# Patient Record
Sex: Male | Born: 1960 | Race: White | Hispanic: No | Marital: Married | Smoking: Former smoker
Health system: Southern US, Community
[De-identification: ages and names within clinical notes are randomized; demographics above are authoritative.]

## PROBLEM LIST (undated history)

## (undated) DIAGNOSIS — J45909 Unspecified asthma, uncomplicated: Secondary | ICD-10-CM

## (undated) DIAGNOSIS — J309 Allergic rhinitis, unspecified: Secondary | ICD-10-CM

## (undated) DIAGNOSIS — R6882 Decreased libido: Secondary | ICD-10-CM

## (undated) DIAGNOSIS — K219 Gastro-esophageal reflux disease without esophagitis: Secondary | ICD-10-CM

## (undated) DIAGNOSIS — A048 Other specified bacterial intestinal infections: Secondary | ICD-10-CM

## (undated) DIAGNOSIS — G43909 Migraine, unspecified, not intractable, without status migrainosus: Secondary | ICD-10-CM

## (undated) DIAGNOSIS — G4700 Insomnia, unspecified: Secondary | ICD-10-CM

## (undated) DIAGNOSIS — F4323 Adjustment disorder with mixed anxiety and depressed mood: Secondary | ICD-10-CM

## (undated) DIAGNOSIS — L509 Urticaria, unspecified: Secondary | ICD-10-CM

## (undated) HISTORY — PX: NASAL SEPTUM SURGERY: SHX37

## (undated) HISTORY — PX: TOOTH EXTRACTION: SUR596

## (undated) HISTORY — PX: CYST EXCISION: SHX5701

---

## 1998-07-20 ENCOUNTER — Encounter: Payer: Self-pay | Admitting: *Deleted

## 1998-07-20 ENCOUNTER — Ambulatory Visit (HOSPITAL_COMMUNITY): Admission: RE | Admit: 1998-07-20 | Discharge: 1998-07-20 | Payer: Self-pay | Admitting: *Deleted

## 2004-11-14 ENCOUNTER — Ambulatory Visit: Payer: Self-pay | Admitting: Psychiatry

## 2004-11-15 ENCOUNTER — Inpatient Hospital Stay (HOSPITAL_COMMUNITY): Admission: RE | Admit: 2004-11-15 | Discharge: 2004-11-18 | Payer: Self-pay | Admitting: Psychiatry

## 2007-06-29 ENCOUNTER — Emergency Department (HOSPITAL_COMMUNITY): Admission: EM | Admit: 2007-06-29 | Discharge: 2007-06-29 | Payer: Self-pay | Admitting: Emergency Medicine

## 2008-08-06 ENCOUNTER — Ambulatory Visit (HOSPITAL_BASED_OUTPATIENT_CLINIC_OR_DEPARTMENT_OTHER): Admission: RE | Admit: 2008-08-06 | Discharge: 2008-08-06 | Payer: Self-pay | Admitting: General Surgery

## 2008-08-06 ENCOUNTER — Encounter (INDEPENDENT_AMBULATORY_CARE_PROVIDER_SITE_OTHER): Payer: Self-pay | Admitting: General Surgery

## 2010-06-17 ENCOUNTER — Emergency Department (HOSPITAL_COMMUNITY)
Admission: EM | Admit: 2010-06-17 | Discharge: 2010-06-17 | Disposition: A | Discharge status: Home / Self Care | Payer: No Typology Code available for payment source | Attending: Emergency Medicine | Admitting: Emergency Medicine

## 2010-06-17 DIAGNOSIS — S335XXA Sprain of ligaments of lumbar spine, initial encounter: Secondary | ICD-10-CM | POA: Insufficient documentation

## 2010-06-17 DIAGNOSIS — M545 Low back pain, unspecified: Secondary | ICD-10-CM | POA: Insufficient documentation

## 2010-06-17 DIAGNOSIS — J45909 Unspecified asthma, uncomplicated: Secondary | ICD-10-CM | POA: Insufficient documentation

## 2010-07-05 LAB — POCT HEMOGLOBIN-HEMACUE: Hemoglobin: 15.5 g/dL (ref 13.0–17.0)

## 2010-08-09 NOTE — Op Note (Signed)
NAME:  Jeffery Dougherty, Jeffery Dougherty               ACCOUNT NO.:  [id number]   MEDICAL RECORD NO.:  [id number]          PATIENT TYPE:  AMB   LOCATION:  DSC                          FACILITY:  [facility]   PHYSICIAN:  Gabrielle Dare. Janee Morn, M.D.DATE OF BIRTH:  26-Jan-1961   DATE OF PROCEDURE:  08/06/2008  DATE OF DISCHARGE:                               OPERATIVE REPORT   PREOPERATIVE DIAGNOSIS:  Mass left flank.   POSTOPERATIVE DIAGNOSIS:  Mass left flank.   PROCEDURE:  Excision of mass left flank 4 cm with layered closure.   SURGEON:  Gabrielle Dare. Janee Morn, MD   ANESTHESIA:  General endotracheal.   HISTORY OF PRESENT ILLNESS:  Mr. Monestime is a 50 year old gentleman who I  evaluated in the office for a symptomatic mass on his left flank.  This  is consistent on physical exam with lipoma.  He presents for elective  excision.   PROCEDURE IN DETAIL:  The patient received intravenous antibiotics.  He  was identified in the preop holding area and informed consent was  obtained.  He was brought to the operating room.  General endotracheal  anesthesia was administered by the anesthesia staff.  He was placed in  right side down lateral position.  His lesion had been marked  preoperatively.  This area on the left flank was prepped and draped in  sterile fashion.  Time-out procedure was done.  Marcaine 0.25% with  epinephrine was injected along the planned line of incision and around  this palpable mass.  The incision was made along the tissue lines.  Subcutaneous tissues were dissected down revealing a partially  encapsulated lobulated mass.  This was circumferentially dissected and  excised.  Beneath it, there was another similar mass though it was a  little bit smaller and this was also partially encapsulated.  This was  completely excised as well.  Hemostasis was obtained with Bovie cautery.  Some additional local anesthetic was injected.  The area was irrigated.  Hemostasis was ensured and the wound was then  closed in layers.  After  determining there was no further abnormal mass in the subcutaneous  planes, subcutaneous tissues were approximated with interrupted 3-0  Vicryl suture and the skin was closed with a running 4-0 Monocryl  subcuticular stitch followed by Dermabond.  Sponge, needle, and  instrument counts were all correct.  The patient tolerated the procedure  well without apparent complication and was taken to recovery room in  stable condition.      Gabrielle Dare Janee Morn, M.D.  Electronically Signed     BET/MEDQ  D:  08/06/2008  T:  08/07/2008  Job:  [id number]

## 2010-08-12 NOTE — Discharge Summary (Signed)
NAME:  Jeffery Dougherty, Jeffery Dougherty NO.:  [id number]   MEDICAL RECORD NO.:  [id number]          PATIENT TYPE:  IPS   LOCATION:  0507                          FACILITY:  [facility]   PHYSICIAN:  Geoffery Lyons, M.D.      DATE OF BIRTH:  1960/10/07   DATE OF ADMISSION:  11/15/2004  DATE OF DISCHARGE:  11/18/2004                                 DISCHARGE SUMMARY   CHIEF COMPLAINT AND PRESENT ILLNESS:  This was the first admission to [facility] for this 50 year old married white male voluntarily  admitted.  History of depression, suicidal thoughts, no specific plan.  Very  stressed.  His ex-wife took custody of his 65 year old daughter a year prior  to this admission.  The daughter was living with him for the past 7-8 years  prior to ex-wife again taking custody of his child.  Lost weight, lost about  15 pounds.  Sleeping has been decreased.  Has been having passive suicidal  thoughts.  Stated that he did not want to be in the unit, pressing homicidal  ideation towards his ex-wife.  Denied any psychotic symptoms.   PAST PSYCHIATRIC HISTORY:  First time at [facility].   ALCOHOL/DRUG HISTORY:  Denies the active use of any substances.   MEDICAL HISTORY:  Noncontributory.   MEDICATIONS:  Claims he takes an occasional Ambien.   PHYSICAL EXAMINATION:  Performed and failed to show any acute findings.   LABORATORY DATA:  CBC with white blood cells 6.0, hemoglobin 16.1.  Blood  chemistry with SGOT 20, SGPT 18, TSH 0.487.  Urine drug screen positive for  marijuana.   MENTAL STATUS EXAM:  Fully alert, cooperative male with fair eye contact.  Speech was clear, normal rate, tempo and production.  Mood feeling hopeless,  depressed, anxious.  Affect teary-eyed.  Thought processes were logical,  coherent and relevant.  No evidence of delusions.  No active suicidal or  homicidal ideation.  Ruminating over his daughter and the situation with his  ex-wife.  No hallucinations.   Cognition was well-preserved.   ADMISSION DIAGNOSES:  AXIS I:  Major depressive disorder.  Marijuana abuse.  AXIS II:  No diagnosis.  AXIS III:  No diagnosis.  AXIS IV:  Moderate.  AXIS V:  GAF upon admission 35; highest GAF in the last year 65.   HOSPITAL COURSE:  He was admitted.  He was started in individual and group  psychotherapy.  He was given trazodone for sleep.  He was given Zyprexa 5 mg  every six hours as needed for agitation.  He was prescribed some trazodone  and some Ativan.  Placed on Zoloft 25 mg per day and Seroquel at bedtime.  Zoloft was increased to 50 mg.  He endorsed he had difficulty dealing with  the conflict with the ex-wife which resulted in losing custody of the  daughter.  Claims his ex-wife has influenced the daughter and she now does  not want to see him.  Endorsed she has gotten increasingly more upset,  depressed, cannot see the __________ stress, cannot stop thinking about the  daughter,  worries, ruminates, endorses crying spells, cannot be settled  enough to do his job.  Overwhelmed with what was going on with the daughter.  Some passive suicidal ideation.  Could contract but was feeling like giving  up.  We worked with the grief, the loss, coping skills.  We worked with the  Zoloft.  On August 22nd, he claimed that he realized that there was little  he could do in terms of the relationship with the daughter other than  keeping the door open.  Continued to deal with the loss of the relationship,  dealing with the frustration and not being able to see her.  Was able to  express how angry and bitter he was.  As the hospitalization progressed, he  was able to work on his coping skills, on his frustration and his anger.  On  August 25th, he felt he had obtained full benefit from the hospitalization.  He was in full contact with reality.  There were no suicidal ideation, no  homicidal ideation, no hallucinations, no delusions, encouraged as he was  starting  to feel better.  Has worked on Pharmacologist, stress management,  willing to pursue further stabilization through outpatient work.   DISCHARGE DIAGNOSES:  AXIS I:  Major depression.  Anxiety disorder not  otherwise specified.  AXIS II:  No diagnosis.  AXIS III:  No diagnosis.  AXIS IV:  Moderate.  AXIS V:  GAF upon discharge 55.   DISCHARGE MEDICATIONS:  1.  Trazodone 50 mg at night for sleep.  2.  Seroquel 25 mg at night.  3.  Zoloft 50 mg per day.   FOLLOW UP:  [employer] consultant.      Geoffery Lyons, M.D.  Electronically Signed     IL/MEDQ  D:  12/19/2004  T:  12/20/2004  Job:  [id number]

## 2010-08-12 NOTE — H&P (Signed)
NAME:  Jeffery Dougherty, Jeffery Dougherty NO.:  [id number]   MEDICAL RECORD NO.:  [id number]          PATIENT TYPE:  IPS   LOCATION:  0407                          FACILITY:  [facility]   PHYSICIAN:  Geoffery Lyons, M.D.      DATE OF BIRTH:  13-May-1960   DATE OF ADMISSION:  11/15/2004  DATE OF DISCHARGE:                         PSYCHIATRIC ADMISSION ASSESSMENT   A 50 year old married white male voluntarily admitted on November 14, 2004.   HISTORY OF PRESENT ILLNESS:  Patient is admitted with a history of  depression with suicidal thoughts but no specific plan.  The patient is very  stressed.  His ex-wife took custody of his 70 year old daughter  approximately a year ago.  Patient's daughter was living with him for the  past 7-8 years prior to ex-wife again taking custody of this child.  He has  lot weight, lost about 15 pounds.  His sleeping has been decreased.  He has  been having passive suicidal thoughts and states he does not want to be  here.  Patient is expressing homicidal ideation towards his ex-wife.  He  denies any psychotic symptoms.   PAST PSYCHIATRIC HISTORY:  First admission to [facility]   SOCIAL HISTORY:  This is a 50 year old married white male, second marriage.  Married for 5 years.  He has a 74 year old step-son and a 74 year old  daughter who is currently with the ex-wife.  Patient has a high school  diploma and works at a [employer].  No criminal charges.   FAMILY HISTORY:  Denies.   ALCOHOL AND DRUG HISTORY:  Patient smokes.  No apparent alcohol or drug use.   PRIMARY CARE Yazmin Locher:  None.   MEDICAL PROBLEMS:  None.   MEDICATIONS:  Patient states he takes an occasional Ambien for sleep.   DRUG ALLERGIES:  No known drug allergies.   PHYSICAL EXAMINATION:  Patient was assessed at [facility].  This is a male  of short stature, well nourished in no acute distress.  His hemoglobin is  17, hematocrit 50.  BMET is within normal limits.  Urine drug  screen  positive for THC.  Alcohol level less than 5.  His vital signs are within  normal limits with a blood pressure of 128/88.  He is 5 feet 4 inches tall,  weight 102 pounds.   MENTAL STATUS EXAM:  He is a fully alert male cooperative with fair eye  contact.  Speech is clear with normal pace and tone.  Patient is feeling  very hopeless, depressed and anxious.  Patient is very teary-eyed.  Thought  processes are coherent, no evidence of psychosis.  Patient is, however,  ruminating over his daughter and situation with his ex-wife.  Cognitive  function intact.  Memory is good.  Judgment and insight are fair.  Average  intelligence.   ADMISSION DIAGNOSES:  AXIS I:  Major depressive disorder.  Tetrahydrocannabinol abuse.  AXIS II:  Deferred.  AXIS III:  None.  AXIS IV:  Problems with primary support group, other psychosocial problems.  AXIS V:  Current is 35, past year 68.   PLAN:  Stabilize mood and thinking, contract for safety.  We will initiate  Zoloft.  Risks and benefits were discussed.  Medication compliance will be  reinforced.  We will consider family session with his wife.  Patient is to  follow up with mental health.  He many need some therapy.  Tentative length  of stay is 5-6 days.      Landry Corporal, N.P.      Geoffery Lyons, M.D.  Electronically Signed    JO/MEDQ  D:  11/16/2004  T:  11/16/2004  Job:  [id number]

## 2012-05-27 ENCOUNTER — Encounter (HOSPITAL_COMMUNITY): Payer: Self-pay | Admitting: Emergency Medicine

## 2012-05-27 ENCOUNTER — Emergency Department (HOSPITAL_COMMUNITY)
Admission: EM | Admit: 2012-05-27 | Discharge: 2012-05-27 | Disposition: A | Discharge status: Home / Self Care | Payer: 59 | Attending: Emergency Medicine | Admitting: Emergency Medicine

## 2012-05-27 ENCOUNTER — Emergency Department (HOSPITAL_COMMUNITY): Payer: 59

## 2012-05-27 DIAGNOSIS — G43909 Migraine, unspecified, not intractable, without status migrainosus: Secondary | ICD-10-CM | POA: Insufficient documentation

## 2012-05-27 DIAGNOSIS — Z79899 Other long term (current) drug therapy: Secondary | ICD-10-CM | POA: Insufficient documentation

## 2012-05-27 DIAGNOSIS — Z87891 Personal history of nicotine dependence: Secondary | ICD-10-CM | POA: Insufficient documentation

## 2012-05-27 DIAGNOSIS — J45909 Unspecified asthma, uncomplicated: Secondary | ICD-10-CM | POA: Insufficient documentation

## 2012-05-27 DIAGNOSIS — R51 Headache: Secondary | ICD-10-CM | POA: Insufficient documentation

## 2012-05-27 HISTORY — DX: Unspecified asthma, uncomplicated: J45.909

## 2012-05-27 HISTORY — DX: Migraine, unspecified, not intractable, without status migrainosus: G43.909

## 2012-05-27 MED ORDER — ONDANSETRON 4 MG PO TBDP
8.0000 mg | ORAL_TABLET | Freq: Once | ORAL | Status: AC
  Administered 2012-05-27: 8 mg via ORAL
  Filled 2012-05-27: qty 2

## 2012-05-27 MED ORDER — OXYCODONE-ACETAMINOPHEN 5-325 MG PO TABS
1.0000 | ORAL_TABLET | Freq: Once | ORAL | Status: AC
  Administered 2012-05-27: 1 via ORAL
  Filled 2012-05-27: qty 1

## 2012-05-27 NOTE — ED Provider Notes (Signed)
History     CSN: [id number]  Arrival date & time 05/27/12  1338   First MD Initiated Contact with Patient 05/27/12 1555      Chief Complaint  Patient presents with  . Headache    (Consider location/radiation/quality/duration/timing/severity/associated sxs/prior treatment) HPI Comments: Jeffery Dougherty is a 52 y.o. Male who complains of a headache that started yesterday. He has had similar headaches for months, approximately every 2 weeks. The pain is still in bifrontal. He saw his doctor about the pain and was told that it might be a sinus problem, aggravated by cold weather. He denies blurred vision, fever, chills, nausea, vomiting. He has had some weakness, occasionally, when he gets the headache. Typically he goes to sleep and the pain gets better. His doctor, has treated him with IM medications, in the office. He has not been seen by a specialist, yet. There are no known modifying factors . He denies use of tobacco, alcohol, or illegal drugs.  Patient is a 52 y.o. male presenting with headaches. The history is provided by the patient.  Headache   Past Medical History  Diagnosis Date  . Asthma   . Migraine     History reviewed. No pertinent past surgical history.  History reviewed. No pertinent family history.  History  Substance Use Topics  . Smoking status: Former Games developer  . Smokeless tobacco: Not on file  . Alcohol Use: No      Review of Systems  Neurological: Positive for headaches.  All other systems reviewed and are negative.    Allergies  Review of patient's allergies indicates no known allergies.  Home Medications   Current Outpatient Rx  Name  Route  Sig  Dispense  Refill  . albuterol (PROVENTIL HFA;VENTOLIN HFA) 108 (90 BASE) MCG/ACT inhaler   Inhalation   Inhale 2 puffs into the lungs 4 (four) times daily as needed for wheezing or shortness of breath.         . ALPRAZolam (XANAX) 0.5 MG tablet   Oral   Take 0.5 mg by mouth once.         Marland Kitchen  aspirin-acetaminophen-caffeine (EXCEDRIN MIGRAINE) 250-250-65 MG per tablet   Oral   Take 2 tablets by mouth daily as needed for pain.         . cetirizine (ZYRTEC) 5 MG tablet   Oral   Take 5 mg by mouth daily.         Marland Kitchen Dextromethorphan-Guaifenesin (TUSSIN COUGH DM PO)   Oral   Take 5 mLs by mouth once.         Marland Kitchen esomeprazole (NEXIUM) 40 MG capsule   Oral   Take 40 mg by mouth daily before breakfast.         . Multiple Vitamin (MULTIVITAMIN WITH MINERALS) TABS   Oral   Take 1 tablet by mouth daily.           BP 120/83  Pulse 85  Temp(Src) 97.9 F (36.6 C) (Oral)  Resp 18  SpO2 98%  Physical Exam  Nursing note and vitals reviewed. Constitutional: He is oriented to person, place, and time. He appears well-developed and well-nourished.  HENT:  Head: Normocephalic and atraumatic.  Right Ear: External ear normal.  Left Ear: External ear normal.  Eyes: Conjunctivae and EOM are normal. Pupils are equal, round, and reactive to light.  Neck: Normal range of motion and phonation normal. Neck supple. No tracheal deviation present.  No meningismus  Cardiovascular: Normal rate, regular rhythm, normal heart  sounds and intact distal pulses.   Pulmonary/Chest: Effort normal and breath sounds normal. He exhibits no bony tenderness.  Abdominal: Soft. Normal appearance. There is no tenderness.  Musculoskeletal: Normal range of motion.  Neurological: He is alert and oriented to person, place, and time. He has normal strength. No cranial nerve deficit or sensory deficit. He exhibits normal muscle tone. Coordination normal.  Romberg negative. Able to stand without dizziness. Able to stand on his leg, independently, and balance for 10 seconds.  Skin: Skin is warm, dry and intact.  Psychiatric: He has a normal mood and affect. His behavior is normal. Judgment and thought content normal.    ED Course  Procedures (including critical care time)  CT ordered to rule out  intracranial mass effect.     Labs Reviewed - No data to display Ct Head Wo Contrast  05/27/2012  *RADIOLOGY REPORT*  Clinical Data: Headache.  CT HEAD WITHOUT CONTRAST  Technique:  Contiguous axial images were obtained from the base of the skull through the vertex without contrast.  Comparison: None.  Findings: The brain appears normal without infarct, hemorrhage, mass lesion, mass effect, midline shift or abnormal extra-axial fluid collection.  There is no hydrocephalus or pneumocephalus. The calvarium is intact.  Imaged paranasal sinuses and mastoid air cells are clear.  IMPRESSION: Negative exam.   Original Report Authenticated By: Holley Dexter, M.D.    Nursing notes, applicable records and vitals reviewed.  Radiologic Images/Reports reviewed.   1. Headache       MDM  Nonspecific headache. Doubt meningitis, acute, brain bleed, or sinusitis. CT scan,   Plan: Home Medications- OTC analgesia; Home Treatments- Rest. Work release 1 day; Recommended follow up- PCP for check up next week       Flint Melter, MD 05/27/12 [phone]

## 2012-05-27 NOTE — ED Notes (Signed)
Pt states he has been having migraines for 2 mo and has been following up with his pcp/ Pt states he had headache yesterday, awoke this am and his mother states pt was slurring speech this am and "i just couldn't think straight." Currently no slurred speech noted. Pt states he has been nauseous and had diarrhea this am. Pt c/o sensitivity to light and sound. Pt states he has also been having chills.

## 2012-05-27 NOTE — ED Notes (Signed)
Pt c/o frequent migraine that is normally treated by his PCP; pt sts nausea and blurry vision

## 2013-07-20 ENCOUNTER — Emergency Department (HOSPITAL_BASED_OUTPATIENT_CLINIC_OR_DEPARTMENT_OTHER): Payer: 59

## 2013-07-20 ENCOUNTER — Emergency Department (HOSPITAL_BASED_OUTPATIENT_CLINIC_OR_DEPARTMENT_OTHER)
Admission: EM | Admit: 2013-07-20 | Discharge: 2013-07-20 | Disposition: A | Discharge status: Home / Self Care | Payer: 59 | Attending: Emergency Medicine | Admitting: Emergency Medicine

## 2013-07-20 ENCOUNTER — Encounter (HOSPITAL_BASED_OUTPATIENT_CLINIC_OR_DEPARTMENT_OTHER): Payer: Self-pay | Admitting: Emergency Medicine

## 2013-07-20 DIAGNOSIS — R1032 Left lower quadrant pain: Secondary | ICD-10-CM | POA: Insufficient documentation

## 2013-07-20 DIAGNOSIS — K219 Gastro-esophageal reflux disease without esophagitis: Secondary | ICD-10-CM | POA: Insufficient documentation

## 2013-07-20 DIAGNOSIS — Z79899 Other long term (current) drug therapy: Secondary | ICD-10-CM | POA: Insufficient documentation

## 2013-07-20 DIAGNOSIS — J45909 Unspecified asthma, uncomplicated: Secondary | ICD-10-CM | POA: Insufficient documentation

## 2013-07-20 DIAGNOSIS — G43909 Migraine, unspecified, not intractable, without status migrainosus: Secondary | ICD-10-CM | POA: Insufficient documentation

## 2013-07-20 DIAGNOSIS — F4323 Adjustment disorder with mixed anxiety and depressed mood: Secondary | ICD-10-CM | POA: Insufficient documentation

## 2013-07-20 DIAGNOSIS — Z8619 Personal history of other infectious and parasitic diseases: Secondary | ICD-10-CM | POA: Insufficient documentation

## 2013-07-20 DIAGNOSIS — Z87891 Personal history of nicotine dependence: Secondary | ICD-10-CM | POA: Insufficient documentation

## 2013-07-20 DIAGNOSIS — R109 Unspecified abdominal pain: Secondary | ICD-10-CM

## 2013-07-20 DIAGNOSIS — Z872 Personal history of diseases of the skin and subcutaneous tissue: Secondary | ICD-10-CM | POA: Insufficient documentation

## 2013-07-20 HISTORY — DX: Allergic rhinitis, unspecified: J30.9

## 2013-07-20 HISTORY — DX: Adjustment disorder with mixed anxiety and depressed mood: F43.23

## 2013-07-20 HISTORY — DX: Decreased libido: R68.82

## 2013-07-20 HISTORY — DX: Gastro-esophageal reflux disease without esophagitis: K21.9

## 2013-07-20 HISTORY — DX: Urticaria, unspecified: L50.9

## 2013-07-20 HISTORY — DX: Other specified bacterial intestinal infections: A04.8

## 2013-07-20 HISTORY — DX: Insomnia, unspecified: G47.00

## 2013-07-20 MED ORDER — HYDROMORPHONE HCL PF 1 MG/ML IJ SOLN
0.5000 mg | Freq: Once | INTRAMUSCULAR | Status: AC
  Administered 2013-07-20: 0.5 mg via INTRAVENOUS
  Filled 2013-07-20: qty 1

## 2013-07-20 MED ORDER — ONDANSETRON HCL 4 MG/2ML IJ SOLN
4.0000 mg | Freq: Once | INTRAMUSCULAR | Status: AC
  Administered 2013-07-20: 4 mg via INTRAVENOUS
  Filled 2013-07-20: qty 2

## 2013-07-20 MED ORDER — SODIUM CHLORIDE 0.9 % IV SOLN
Freq: Once | INTRAVENOUS | Status: AC
  Administered 2013-07-20: 18:00:00 via INTRAVENOUS

## 2013-07-20 NOTE — ED Notes (Signed)
C/o left abdominal pain that radiates to his back.  C/o nausea, vomiting, and diarrhea.  Denies fever, dysuria.

## 2013-07-20 NOTE — Discharge Instructions (Signed)
Abdominal Pain, Adult °Many things can cause abdominal pain. Usually, abdominal pain is not caused by a disease and will improve without treatment. It can often be observed and treated at home. Your health care provider will do a physical exam and possibly order blood tests and X-rays to help determine the seriousness of your pain. However, in many cases, more time must pass before a clear cause of the pain can be found. Before that point, your health care provider may not know if you need more testing or further treatment. °HOME CARE INSTRUCTIONS  °Monitor your abdominal pain for any changes. The following actions may help to alleviate any discomfort you are experiencing: °· Only take over-the-counter or prescription medicines as directed by your health care provider. °· Do not take laxatives unless directed to do so by your health care provider. °· Try a clear liquid diet (broth, tea, or water) as directed by your health care provider. Slowly move to a bland diet as tolerated. °SEEK MEDICAL CARE IF: °· You have unexplained abdominal pain. °· You have abdominal pain associated with nausea or diarrhea. °· You have pain when you urinate or have a bowel movement. °· You experience abdominal pain that wakes you in the night. °· You have abdominal pain that is worsened or improved by eating food. °· You have abdominal pain that is worsened with eating fatty foods. °SEEK IMMEDIATE MEDICAL CARE IF:  °· Your pain does not go away within 2 hours. °· You have a fever. °· You keep throwing up (vomiting). °· Your pain is felt only in portions of the abdomen, such as the right side or the left lower portion of the abdomen. °· You pass bloody or black tarry stools. °MAKE SURE YOU: °· Understand these instructions.   °· Will watch your condition.   °· Will get help right away if you are not doing well or get worse.   °Document Released: 12/21/2004 Document Revised: 01/01/2013 Document Reviewed: 11/20/2012 °Exit[employer]® Patient  Information ©2014 [employer]. ° °

## 2013-07-20 NOTE — ED Provider Notes (Signed)
CSN: [id number]     Arrival date & time 07/20/13  1601 History   First MD Initiated Contact with Patient 07/20/13 1639     Chief Complaint  Patient presents with  . Abdominal Pain     (Consider location/radiation/quality/duration/timing/severity/associated sxs/prior Treatment) Patient is a 53 y.o. male presenting with abdominal pain. The history is provided by the patient and the spouse. No language interpreter was used.  Abdominal Pain Associated symptoms: no dysuria, no fever, no hematuria, no nausea and no vomiting   Associated symptoms comment:  Abdominal pain in the LLQ radiating into left lower back for the past 2 weeks. He notices more in the mornings while getting ready for work and the pain eases off during the day. For the past several days the pain has been lasting the majority of the day. No N, V. No change in bowel movements or melena. No fever. He was seen by his doctor this week and was sent here for further evaluation by CT scan for abnormal lab studies, specifically an elevated WBC count of 16.   Past Medical History  Diagnosis Date  . Asthma   . Migraine   . Adjustment disorder with mixed anxiety and depressed mood   . Allergic rhinitis   . Decreased libido   . Esophageal reflux   . Helicobacter pylori (H. pylori) infection   . Hives   . Insomnia   . Migraine    Past Surgical History  Procedure Laterality Date  . Tooth extraction    . Cyst excision      Back, Lip  . Nasal septum surgery     No family history on file. History  Substance Use Topics  . Smoking status: Former Games developermoker  . Smokeless tobacco: Not on file  . Alcohol Use: No    Review of Systems  Constitutional: Negative for fever.  Respiratory: Negative.   Cardiovascular: Negative.   Gastrointestinal: Positive for abdominal pain. Negative for nausea, vomiting and blood in stool.  Genitourinary: Negative.  Negative for dysuria, hematuria and testicular pain.  Musculoskeletal: Negative.   Negative for myalgias.  Neurological: Negative.  Negative for weakness.      Allergies  Review of patient's allergies indicates no known allergies.  Home Medications   Prior to Admission medications   Medication Sig Start Date End Date Taking? Authorizing Provider  albuterol (PROVENTIL HFA;VENTOLIN HFA) 108 (90 BASE) MCG/ACT inhaler Inhale 2 puffs into the lungs 4 (four) times daily as needed for wheezing or shortness of breath.    Historical Provider, MD  ALPRAZolam Prudy Feeler(XANAX) 0.5 MG tablet Take 0.5 mg by mouth once.    Historical Provider, MD  aspirin-acetaminophen-caffeine (EXCEDRIN MIGRAINE) 254-268-1420250-250-65 MG per tablet Take 2 tablets by mouth daily as needed for pain.    Historical Provider, MD  cetirizine (ZYRTEC) 5 MG tablet Take 5 mg by mouth daily.    Historical Provider, MD  Dextromethorphan-Guaifenesin (TUSSIN COUGH DM PO) Take 5 mLs by mouth once.    Historical Provider, MD  esomeprazole (NEXIUM) 40 MG capsule Take 40 mg by mouth daily before breakfast.    Historical Provider, MD  Multiple Vitamin (MULTIVITAMIN WITH MINERALS) TABS Take 1 tablet by mouth daily.    Historical Provider, MD   BP 138/76  Pulse 75  Temp(Src) 98.2 F (36.8 C)  Resp 16  Ht 5\' 4"  (1.626 m)  Wt 118 lb (53.524 kg)  BMI 20.24 kg/m2  SpO2 99% Physical Exam  Constitutional: He is oriented to person, place, and time.  He appears well-developed and well-nourished.  HENT:  Head: Normocephalic.  Neck: Normal range of motion. Neck supple.  Cardiovascular: Normal rate and regular rhythm.   Pulmonary/Chest: Effort normal and breath sounds normal.  Abdominal: Soft. Bowel sounds are normal. There is tenderness. There is no rebound and no guarding.  LLQ tenderness with mild guarding. No rebound tenderness.   Genitourinary:  Mild left CVA tenderness.  Musculoskeletal: Normal range of motion.  Neurological: He is alert and oriented to person, place, and time.  Skin: Skin is warm and dry. No rash noted.   Psychiatric: He has a normal mood and affect.    ED Course  Procedures (including critical care time) Labs Review Labs Reviewed - No data to display Ct Abdomen Pelvis W Contrast  07/20/2013   CLINICAL DATA:  Left side abdominal pain radiating to the back. Elevated white blood cell count.  EXAM: CT ABDOMEN AND PELVIS WITH CONTRAST  TECHNIQUE: Multidetector CT imaging of the abdomen and pelvis was performed using the standard protocol following bolus administration of intravenous contrast.  CONTRAST:  100 cc Omnipaque 300.  COMPARISON:  None.  FINDINGS: The lung bases are clear.  No pleural or pericardial effusion.  A few scattered tiny hypo attenuating lesions are identified in the liver measuring less than 1 cm which likely represent cysts. The liver otherwise appears normal. The gallbladder, spleen, biliary tree, kidneys, adrenal glands and pancreas appear normal. Urinary bladder is unremarkable. The stomach, small and large bowel and appendix are unremarkable. There is no lymphadenopathy or fluid. No lytic or sclerotic bony lesion is identified. Degenerative disc disease L5-S1 is noted.  IMPRESSION: No acute finding abdomen or pelvis. No finding to explain the patient's symptoms.   Electronically Signed   By: Drusilla Kannerhomas  Dalessio M.D.   On: 07/20/2013 20:15   Imaging Review No results found.   EKG Interpretation None      MDM   Final diagnoses:  None    1. Abdominal pain  He has a negative CT scan in ED tonight. [facility] CBC, UA, BMET, Lipase, Amylase, with only abnormality of leukocytosis of 16. He appears comfortable here. No vomiting. Sitting up talking with family. Pain for 2 weeks now with negative CT. He has a GI doctor where he is encouraged to follow up this week. Return precautions given.    Arnoldo HookerShari A Xai Frerking, PA-C 07/20/13 2053

## 2013-07-20 NOTE — ED Notes (Signed)
[facility] labs [facility] a [facility]

## 2013-07-20 NOTE — ED Provider Notes (Signed)
Medical screening examination/treatment/procedure(s) were performed by non-physician practitioner and as supervising physician I was immediately available for consultation/collaboration.   EKG Interpretation None        Rolan BuccoMelanie Kadence Mikkelson, MD 07/20/13 2109

## 2013-08-08 ENCOUNTER — Other Ambulatory Visit: Payer: Self-pay | Admitting: Gastroenterology

## 2015-11-20 IMAGING — CT CT ABD-PELV W/ CM
2 of 5 series · 17 of 46 positions shown, 19 images · IV contrast (omnipaque)
Comparison: None.

CLINICAL DATA: Left side abdominal pain radiating to the back.
Elevated white blood cell count.

EXAM:
CT ABDOMEN AND PELVIS WITH CONTRAST
TECHNIQUE: Multidetector CT imaging of the abdomen and pelvis was performed
using the standard protocol following bolus administration of
intravenous contrast.
CONTRAST:  100 cc Omnipaque 300.

[Series 2: abd/pelvis 5.0 b31f · axial · 0.64mm/px · z∈[-451,-101]mm · 14 of 80 slices shown, 16 images]
[im 5/80  soft-tissue]
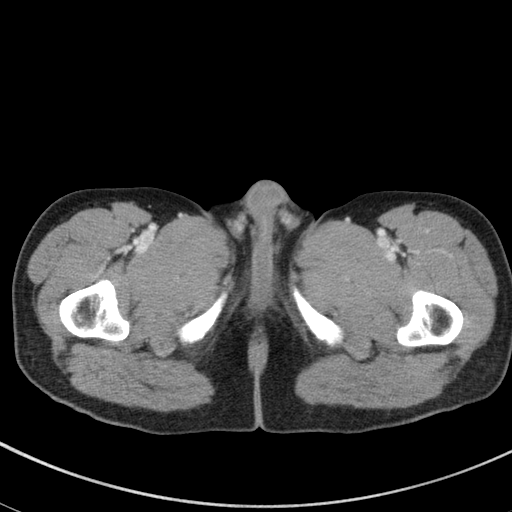
[im 5/80  bone]
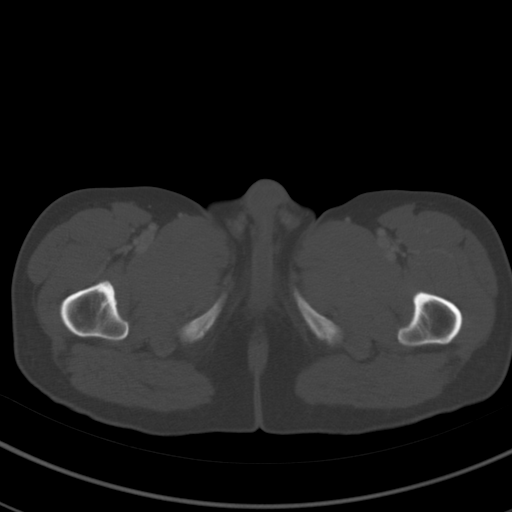
[im 9/80  soft-tissue]
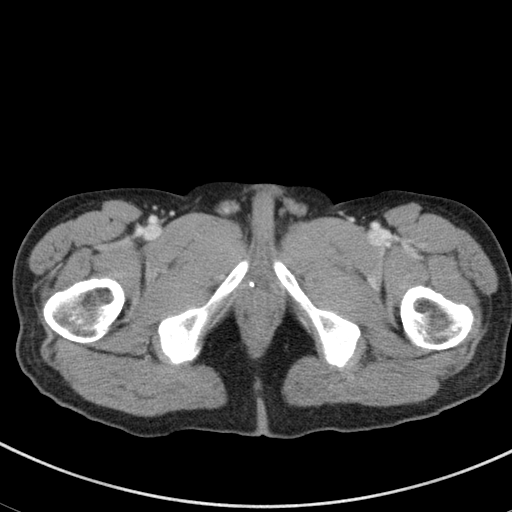
[im 17/80  soft-tissue]
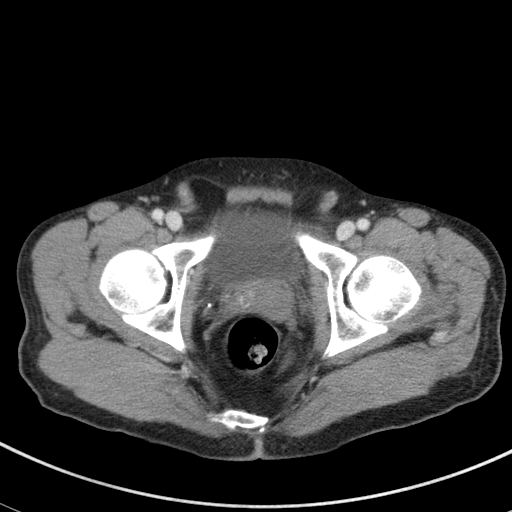
[im 21/80  soft-tissue]
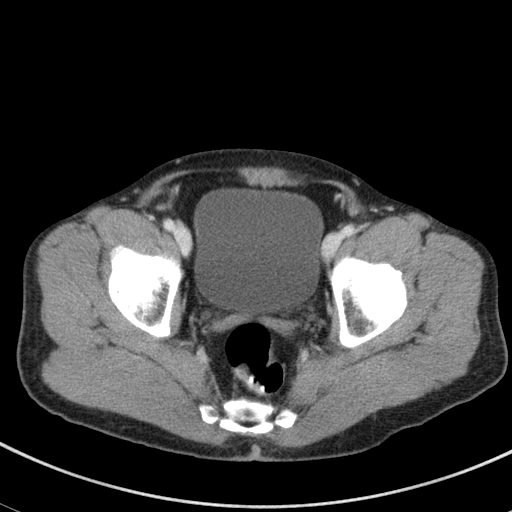
[im 25/80  soft-tissue]
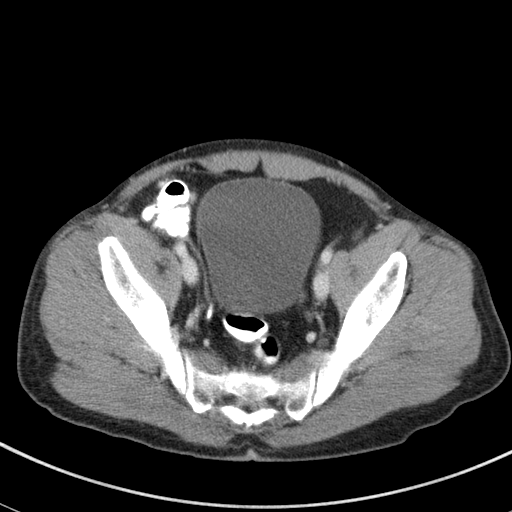
[im 34/80  soft-tissue]
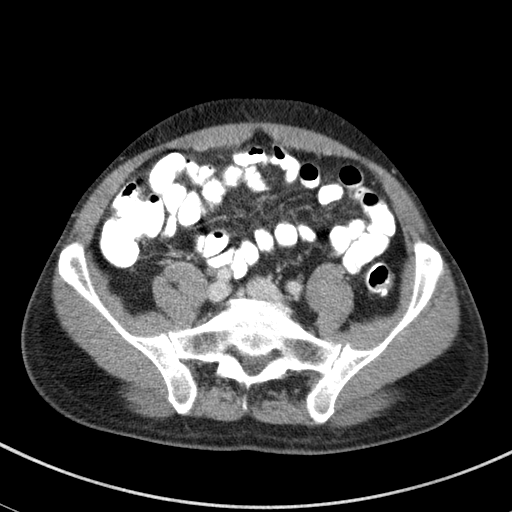
[im 38/80  soft-tissue]
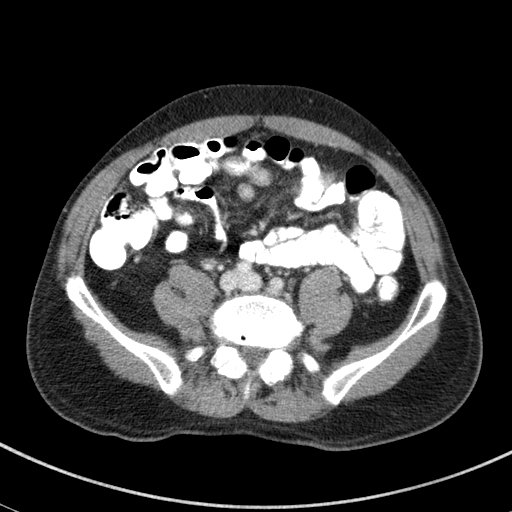
[im 42/80  soft-tissue]
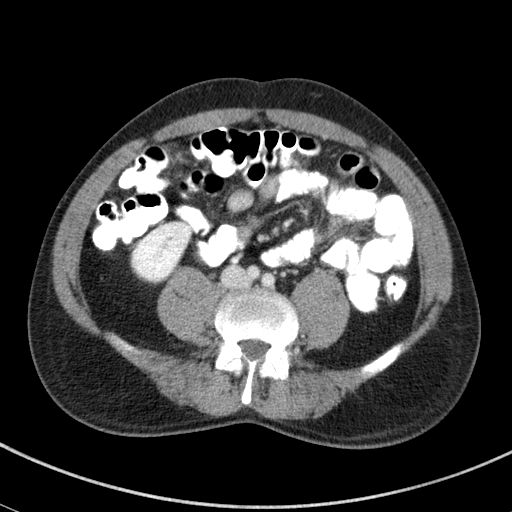
[im 46/80  soft-tissue]
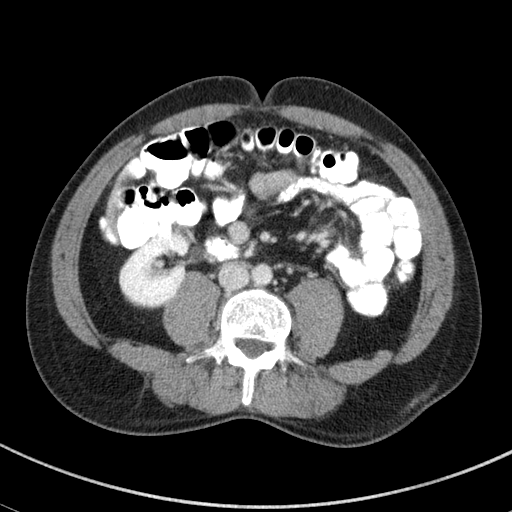
[im 46/80  bone]
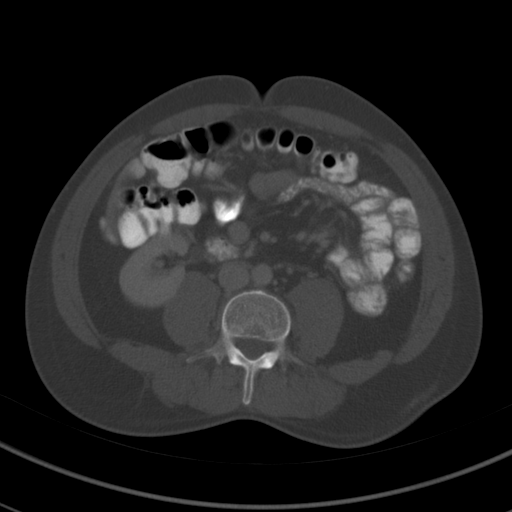
[im 55/80  soft-tissue]
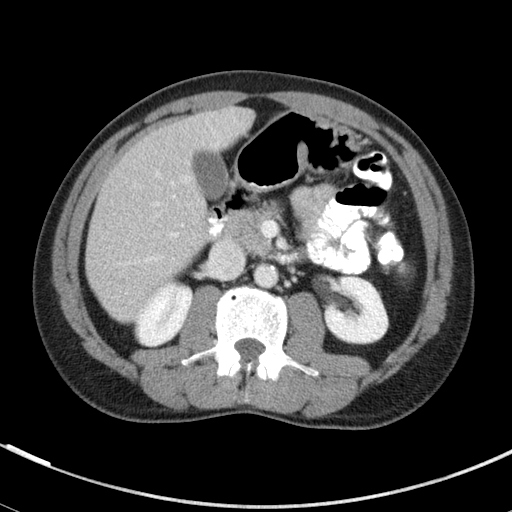
[im 59/80  soft-tissue]
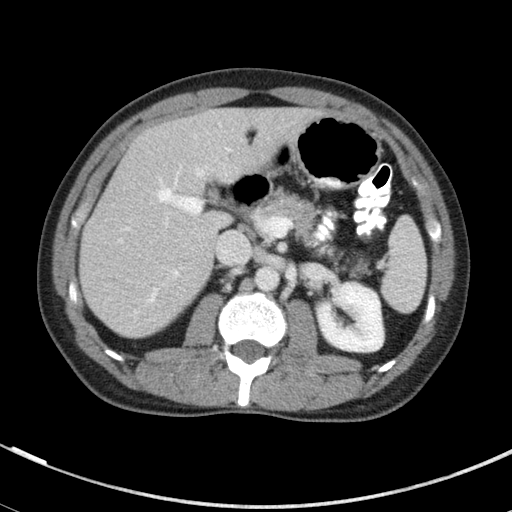
[im 63/80  soft-tissue]
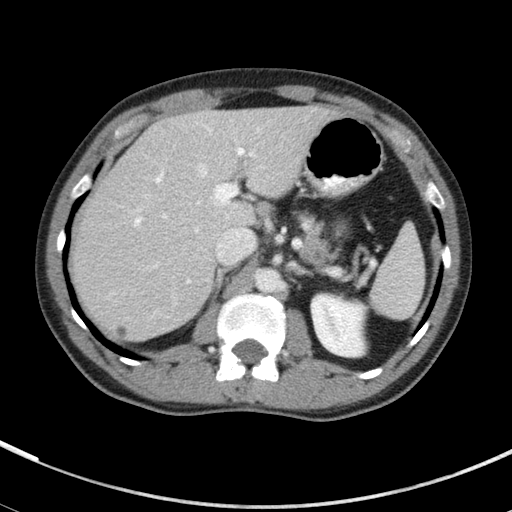
[im 71/80  soft-tissue]
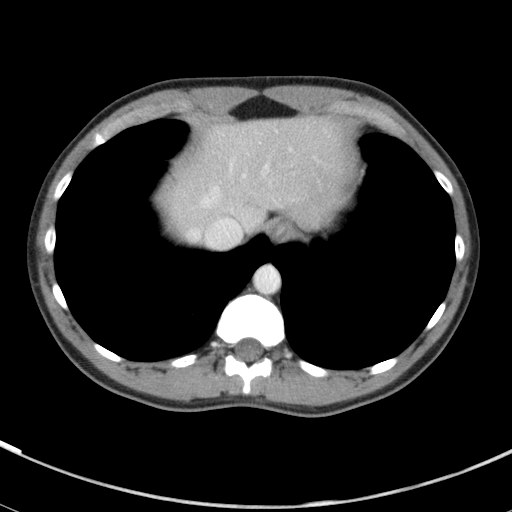
[im 75/80  soft-tissue]
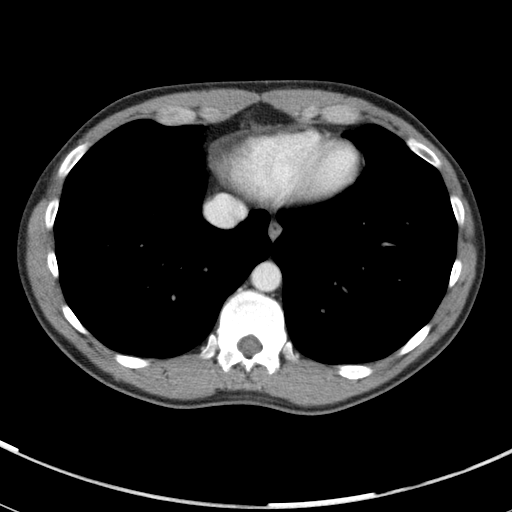

[Series 5: abd/pelvis 3.0 coronal · coronal · 0.70mm/px · 3 of 79 slices shown]
[im 27/79  soft-tissue]
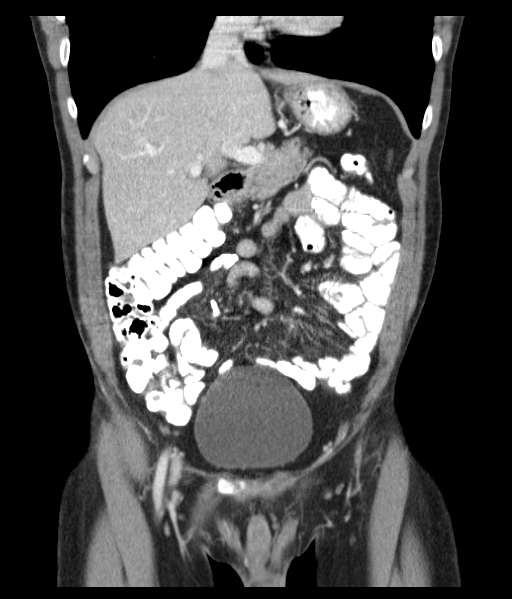
[im 35/79  soft-tissue]
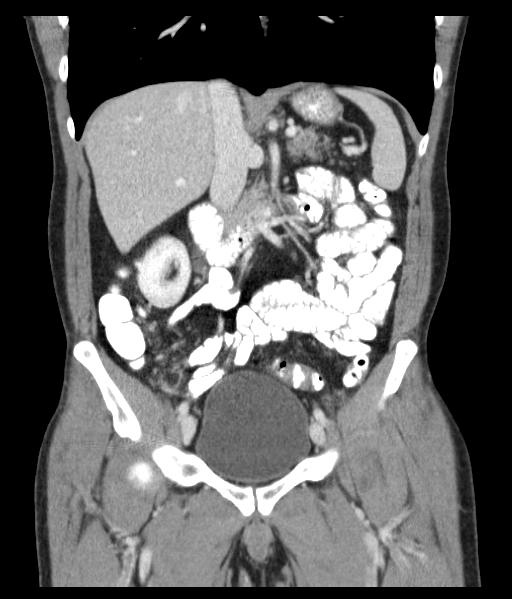
[im 44/79  soft-tissue]
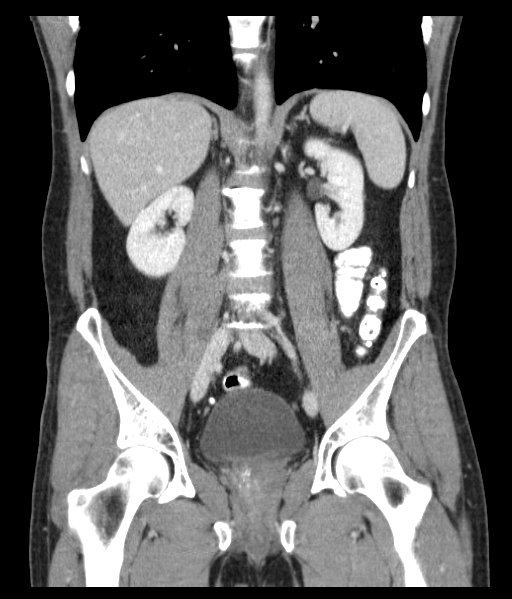

[17 of 46 positions shown; findings below may reference images not displayed]

FINDINGS: The lung bases are clear.  No pleural or pericardial effusion.

A few scattered tiny hypo attenuating lesions are identified in the
liver measuring less than 1 cm which likely represent cysts. The
liver otherwise appears normal. The gallbladder, spleen, biliary
tree, kidneys, adrenal glands and pancreas appear normal. Urinary
bladder is unremarkable. The stomach, small and large bowel and
appendix are unremarkable. There is no lymphadenopathy or fluid. No
lytic or sclerotic bony lesion is identified. Degenerative disc
disease L5-S1 is noted.
IMPRESSION: No acute finding abdomen or pelvis. No finding to explain the
patient's symptoms.
